# Patient Record
Sex: Male | Born: 1990 | Race: Black or African American | Hispanic: No | Marital: Single | Smoking: Never smoker
Health system: Southern US, Community
[De-identification: ages and names within clinical notes are randomized; demographics above are authoritative.]

## PROBLEM LIST (undated history)

## (undated) ENCOUNTER — Emergency Department (HOSPITAL_COMMUNITY): Discharge status: Absent without leave | Payer: Self-pay

---
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
## 2015-02-23 ENCOUNTER — Encounter (HOSPITAL_COMMUNITY): Payer: Self-pay | Admitting: Emergency Medicine

## 2015-02-23 ENCOUNTER — Emergency Department (HOSPITAL_COMMUNITY)
Admission: EM | Admit: 2015-02-23 | Discharge: 2015-02-23 | Disposition: A | Discharge status: Home / Self Care | Payer: Self-pay | Attending: Emergency Medicine | Admitting: Emergency Medicine

## 2015-02-23 DIAGNOSIS — Y9289 Other specified places as the place of occurrence of the external cause: Secondary | ICD-10-CM | POA: Insufficient documentation

## 2015-02-23 DIAGNOSIS — S46911A Strain of unspecified muscle, fascia and tendon at shoulder and upper arm level, right arm, initial encounter: Secondary | ICD-10-CM | POA: Insufficient documentation

## 2015-02-23 DIAGNOSIS — Y998 Other external cause status: Secondary | ICD-10-CM | POA: Insufficient documentation

## 2015-02-23 DIAGNOSIS — Y9389 Activity, other specified: Secondary | ICD-10-CM | POA: Insufficient documentation

## 2015-02-23 DIAGNOSIS — X500XXA Overexertion from strenuous movement or load, initial encounter: Secondary | ICD-10-CM | POA: Insufficient documentation

## 2015-02-23 MED ORDER — METHOCARBAMOL 500 MG PO TABS: 500.0000 mg | ORAL_TABLET | Freq: Two times a day (BID) | ORAL | Status: DC

## 2015-02-23 MED ORDER — IBUPROFEN 800 MG PO TABS: 800.0000 mg | ORAL_TABLET | Freq: Three times a day (TID) | ORAL | Status: DC

## 2015-02-23 NOTE — ED Notes (Signed)
Pt c/o right shoulder pain x 2 days. States it hurts to abduct the arm. States has had "twinges" on and off for a while.

## 2015-02-23 NOTE — ED Provider Notes (Signed)
CSN: [id number]     Arrival date & time 02/23/15  1317 History  By signing my name below, I, Elon SpannerGarrett Cook, attest that this documentation has been prepared under the direction and in the presence of Fayrene HelperBowie Lyssa Hackley, PA-C. Electronically Signed: Elon SpannerGarrett Cook ED Scribe. 02/23/2015. 1:55 PM.    Chief Complaint  Patient presents with  . Shoulder Pain   The history is provided by the patient. No language interpreter was used.   HPI Comments: Gregory Berg is a 24 y.o. left-handed male who presents to the Emergency Department complaining of sharp, recurrently sudden, non-radiating onset right shoulder pain onset two weeks ago; worse with lifting the arm; tx'd successfully with ice/ibuprofen (this am).  He reports frequent heavy lifting where he works at [employer] and worsening yesterday after loading a truck at work.  He denies neck pain, right elbow pain, right arm pain, fever.    History reviewed. No pertinent past medical history. History reviewed. No pertinent past surgical history. History reviewed. No pertinent family history. Social History  Substance Use Topics  . Smoking status: Never Smoker   . Smokeless tobacco: None  . Alcohol Use: No     Review of Systems  Constitutional: Negative for fever.  Musculoskeletal: Positive for arthralgias. Negative for neck pain.      Allergies  Review of patient's allergies indicates no known allergies.  Home Medications   Prior to Admission medications   Not on File   BP 118/69 mmHg  Pulse 85  Temp(Src) 97.9 F (36.6 C) (Oral)  Resp 18  SpO2 98% Physical Exam  Constitutional: He is oriented to person, place, and time. He appears well-developed and well-nourished. No distress.  HENT:  Head: Normocephalic and atraumatic.  Eyes: Conjunctivae and EOM are normal.  Neck: Neck supple. No tracheal deviation present.  Cardiovascular: Normal rate.   Pulmonary/Chest: Effort normal. No respiratory distress.  Musculoskeletal: Normal range of  motion.  No c-spine tenderness.  Tenderness to right lateral trapezius.  Point tenderness noted to posterior right shoulder on palpation at the deltoid insertion.  No gross deformity.  Decreased shoulder flexion, unable to point straight to the sky.   Normal shoulder ab/adduction.  Normal grip strength radial pulse 2+.  Right elbow normal.    Neurological: He is alert and oriented to person, place, and time.  Skin: Skin is warm and dry.  Psychiatric: He has a normal mood and affect. His behavior is normal.  Nursing note and vitals reviewed.   ED Course  Procedures (including critical care time)  DIAGNOSTIC STUDIES: Oxygen Saturation is 98% on RA, normal by my interpretation.    COORDINATION OF CARE:  1:56 PM suspect R shoulder strain.  Low suspicion for radiculopathy, fractures, or dislocation.  Pt is NVI.  Will provide referral to orthopaedist, work note, and muscle relaxant.  Patient acknowledges and agrees with plan.    MDM   Final diagnoses:  Right shoulder strain, initial encounter    BP 118/69 mmHg  Pulse 85  Temp(Src) 97.9 F (36.6 C) (Oral)  Resp 18  SpO2 98%   I personally performed the services described in this documentation, which was scribed in my presence. The recorded information has been reviewed and is accurate.      Fayrene HelperBowie Brexton Sofia, PA-C 02/23/15 1416  Blane OharaJoshua Zavitz, MD 02/23/15 [phone]

## 2015-02-23 NOTE — ED Notes (Signed)
Pt sts right shoulder pain upon waking this am; pt sts worse with movement

## 2015-02-23 NOTE — Discharge Instructions (Signed)

## 2015-04-27 ENCOUNTER — Emergency Department (HOSPITAL_COMMUNITY)
Admission: EM | Admit: 2015-04-27 | Discharge: 2015-04-27 | Disposition: A | Discharge status: Home / Self Care | Payer: Self-pay

## 2015-04-27 NOTE — ED Notes (Signed)
Called x3 for triage with no answer 

## 2016-06-29 ENCOUNTER — Encounter (HOSPITAL_COMMUNITY): Payer: Self-pay

## 2016-06-29 ENCOUNTER — Emergency Department (HOSPITAL_COMMUNITY)
Admission: EM | Admit: 2016-06-29 | Discharge: 2016-06-29 | Disposition: A | Discharge status: Home / Self Care | Payer: Self-pay | Attending: Emergency Medicine | Admitting: Emergency Medicine

## 2016-06-29 ENCOUNTER — Emergency Department (HOSPITAL_COMMUNITY): Payer: Self-pay

## 2016-06-29 DIAGNOSIS — J111 Influenza due to unidentified influenza virus with other respiratory manifestations: Secondary | ICD-10-CM | POA: Insufficient documentation

## 2016-06-29 DIAGNOSIS — J101 Influenza due to other identified influenza virus with other respiratory manifestations: Secondary | ICD-10-CM

## 2016-06-29 LAB — I-STAT CHEM 8, ED
BUN: 8 mg/dL (ref 6–20)
CHLORIDE: 98 mmol/L — AB (ref 101–111)
Calcium, Ion: 1.03 mmol/L — ABNORMAL LOW (ref 1.15–1.40)
Creatinine, Ser: 0.9 mg/dL (ref 0.61–1.24)
Glucose, Bld: 98 mg/dL (ref 65–99)
HEMATOCRIT: 44 % (ref 39.0–52.0)
Hemoglobin: 15 g/dL (ref 13.0–17.0)
POTASSIUM: 3.2 mmol/L — AB (ref 3.5–5.1)
SODIUM: 138 mmol/L (ref 135–145)
TCO2: 22 mmol/L (ref 0–100)

## 2016-06-29 LAB — INFLUENZA PANEL BY PCR (TYPE A & B)
Influenza A By PCR: NEGATIVE
Influenza B By PCR: POSITIVE — AB

## 2016-06-29 MED ORDER — ACETAMINOPHEN 500 MG PO TABS
1000.0000 mg | ORAL_TABLET | Freq: Once | ORAL | Status: AC
  Administered 2016-06-29: 1000 mg via ORAL
  Filled 2016-06-29: qty 2

## 2016-06-29 MED ORDER — IPRATROPIUM-ALBUTEROL 0.5-2.5 (3) MG/3ML IN SOLN
3.0000 mL | Freq: Once | RESPIRATORY_TRACT | Status: AC
  Administered 2016-06-29: 3 mL via RESPIRATORY_TRACT
  Filled 2016-06-29: qty 3

## 2016-06-29 MED ORDER — IBUPROFEN 800 MG PO TABS: 800.0000 mg | ORAL_TABLET | Freq: Three times a day (TID) | ORAL | 0 refills | Status: DC | PRN

## 2016-06-29 MED ORDER — ALBUTEROL SULFATE HFA 108 (90 BASE) MCG/ACT IN AERS
2.0000 | INHALATION_SPRAY | Freq: Once | RESPIRATORY_TRACT | Status: AC
  Administered 2016-06-29: 2 via RESPIRATORY_TRACT
  Filled 2016-06-29: qty 6.7

## 2016-06-29 MED ORDER — IBUPROFEN 800 MG PO TABS
800.0000 mg | ORAL_TABLET | Freq: Once | ORAL | Status: AC
  Administered 2016-06-29: 800 mg via ORAL
  Filled 2016-06-29: qty 1

## 2016-06-29 MED ORDER — BENZONATATE 100 MG PO CAPS: 100.0000 mg | ORAL_CAPSULE | Freq: Every evening | ORAL | 0 refills | Status: DC | PRN

## 2016-06-29 NOTE — ED Notes (Signed)
Spoke to lab, states influenza swab positive for Flu B

## 2016-06-29 NOTE — ED Notes (Signed)
Pt refused VS at this time, states he is cramping and does not want O2 sat or BP on his arm/hands.

## 2016-06-29 NOTE — ED Notes (Signed)
Pt continues to refuse BP, stating "it gets too tight and causes more cramping". Pt agreed to keep pulse ox on finger.

## 2016-06-29 NOTE — ED Notes (Signed)
Pt. Hyperventilating, encourage proper breathing. Slow, in through nose out through mouth.

## 2016-06-29 NOTE — ED Notes (Signed)
Patient transported to X-ray 

## 2016-06-29 NOTE — Discharge Instructions (Signed)
Read the information below.  Use the prescribed medication as directed.  Please discuss all new medications with your pharmacist.  You may return to the Emergency Department at any time for worsening condition or any new symptoms that concern you.  If you develop high fevers that do not resolve with tylenol or ibuprofen, you have difficulty swallowing or breathing, or you are unable to tolerate fluids by mouth, return to the ER for a recheck.    °

## 2016-06-29 NOTE — ED Triage Notes (Signed)
Patient complains of cough and cold symptoms x 2 days. On arrival hyperventilating with arms contracted and screaming out at staff that he cannot breath. Alert and oriented, difficult to assess due to anxiety. sats 100%

## 2016-06-29 NOTE — ED Provider Notes (Signed)
[facility]-EMERGENCY DEPT Provider Note   CSN: [id number] Arrival date & time: 06/29/16  0934     History   Chief Complaint Chief Complaint  Patient presents with  . cough, hyperventilating    HPI Gregory Berg is a 26 y.o. male.  HPI   Patient presents with subjective fever, myalgias, sneezing, coughing, SOB, sore throat, nausea, vomiting, diarrhea since yesterday.  He has used mucinex, vaporub, theraflu without improvement. Denies abdominal pain, hemoptysis.  He has no medical problems.  He did not get a flu shot this season.    History reviewed. No pertinent past medical history.  There are no active problems to display for this patient.   History reviewed. No pertinent surgical history.     Home Medications    Prior to Admission medications   Medication Sig Start Date End Date Taking? Authorizing Provider  benzonatate (TESSALON) 100 MG capsule Take 1 capsule (100 mg total) by mouth at bedtime as needed for cough. 06/29/16   Trixie DredgeEmily Elease Swarm, PA-C  ibuprofen (ADVIL,MOTRIN) 800 MG tablet Take 1 tablet (800 mg total) by mouth every 8 (eight) hours as needed for mild pain or moderate pain. 06/29/16   Trixie DredgeEmily Naliyah Neth, PA-C  methocarbamol (ROBAXIN) 500 MG tablet Take 1 tablet (500 mg total) by mouth 2 (two) times daily. 02/23/15   Fayrene HelperBowie Tran, PA-C    Family History No family history on file.  Social History Social History  Substance Use Topics  . Smoking status: Never Smoker  . Smokeless tobacco: Not on file  . Alcohol use No     Allergies   Patient has no known allergies.   Review of Systems Review of Systems  All other systems reviewed and are negative.    Physical Exam Updated Vital Signs BP 140/67 (BP Location: Right Arm)   Pulse 75   Temp (S) 100.6 F (38.1 C) (Oral)   Resp 16   Ht 6\' 3"  (1.905 m)   Wt 76.7 kg   SpO2 98%   BMI 21.12 kg/m   Physical Exam  Constitutional: He appears well-developed and well-nourished. No distress.  HENT:  Head:  Normocephalic and atraumatic.  Neck: Neck supple.  Cardiovascular: Normal rate and regular rhythm.   Pulmonary/Chest: Effort normal and breath sounds normal. Tachypnea noted. No respiratory distress. He has no wheezes. He has no rales.  Abdominal: Soft. He exhibits no distension and no mass. There is no tenderness. There is no rebound and no guarding.  Neurological: He is alert. He exhibits normal muscle tone.  Skin: He is not diaphoretic.  Nursing note and vitals reviewed.    ED Treatments / Results  Labs (all labs ordered are listed, but only abnormal results are displayed) Labs Reviewed  INFLUENZA PANEL BY PCR (TYPE A & B) - Abnormal; Notable for the following:       Result Value   Influenza B By PCR POSITIVE (*)    All other components within normal limits  I-STAT CHEM 8, ED - Abnormal; Notable for the following:    Potassium 3.2 (*)    Chloride 98 (*)    Calcium, Ion 1.03 (*)    All other components within normal limits    EKG  EKG Interpretation None       Radiology Dg Chest 2 View  Result Date: 06/29/2016 CLINICAL DATA:  Cough and congestion for 2 days. EXAM: CHEST  2 VIEW COMPARISON:  None. FINDINGS: The cardiomediastinal silhouette is unremarkable. Mild peribronchial thickening noted. There is no evidence of focal  airspace disease, pulmonary edema, suspicious pulmonary nodule/mass, pleural effusion, or pneumothorax. No acute bony abnormalities are identified. IMPRESSION: Mild peribronchial thickening without focal pneumonia. Electronically Signed   By: Harmon Pier M.D.   On: 06/29/2016 11:02    Procedures Procedures (including critical care time)  Medications Ordered in ED Medications  ipratropium-albuterol (DUONEB) 0.5-2.5 (3) MG/3ML nebulizer solution 3 mL (3 mLs Nebulization Given 06/29/16 1038)  ibuprofen (ADVIL,MOTRIN) tablet 800 mg (800 mg Oral Given 06/29/16 1240)  acetaminophen (TYLENOL) tablet 1,000 mg (1,000 mg Oral Given 06/29/16 1312)  albuterol  (PROVENTIL HFA;VENTOLIN HFA) 108 (90 Base) MCG/ACT inhaler 2 puff (2 puffs Inhalation Given 06/29/16 1341)     Initial Impression / Assessment and Plan / ED Course  I have reviewed the triage vital signs and the nursing notes.  Pertinent labs & imaging results that were available during my care of the patient were reviewed by me and considered in my medical decision making (see chart for details).     Afebrile, nontoxic patient with constellation of symptoms suggestive of viral syndrome, influenza positive.  No concerning findings on exam.  Discharged home with supportive care, PCP follow up.  Discussed result, findings, treatment, and follow up  with patient.  Pt given return precautions.  Pt verbalizes understanding and agrees with plan.      Final Clinical Impressions(s) / ED Diagnoses   Final diagnoses:  Influenza B    New Prescriptions Discharge Medication List as of 06/29/2016  1:33 PM    START taking these medications   Details  benzonatate (TESSALON) 100 MG capsule Take 1 capsule (100 mg total) by mouth at bedtime as needed for cough., Starting Sun 06/29/2016, Print         [place], PA-C 06/29/16 1428    Lorre Nick, MD 06/29/16 [phone]

## 2016-06-29 NOTE — ED Notes (Signed)
ED Provider at bedside. 

## 2016-06-29 NOTE — ED Notes (Signed)
Pt. C/o blood pressure cuff too tight and making me breathe bad.

## 2016-06-29 NOTE — ED Notes (Signed)
Pt. Slamming his belongings around in the room.

## 2016-12-09 ENCOUNTER — Encounter (HOSPITAL_COMMUNITY): Payer: Self-pay | Admitting: *Deleted

## 2016-12-09 DIAGNOSIS — K0889 Other specified disorders of teeth and supporting structures: Secondary | ICD-10-CM | POA: Insufficient documentation

## 2016-12-09 DIAGNOSIS — Z5321 Procedure and treatment not carried out due to patient leaving prior to being seen by health care provider: Secondary | ICD-10-CM | POA: Insufficient documentation

## 2016-12-09 NOTE — ED Triage Notes (Signed)
Right upper dental pain x 4 days.

## 2016-12-10 ENCOUNTER — Emergency Department (HOSPITAL_COMMUNITY)
Admission: EM | Admit: 2016-12-10 | Discharge: 2016-12-10 | Disposition: A | Discharge status: Home / Self Care | Payer: Self-pay | Attending: Emergency Medicine | Admitting: Emergency Medicine

## 2016-12-10 NOTE — ED Notes (Signed)
Pt called in lobby several times for reassessment, no response

## 2016-12-11 ENCOUNTER — Encounter (HOSPITAL_COMMUNITY): Payer: Self-pay | Admitting: Emergency Medicine

## 2016-12-11 ENCOUNTER — Ambulatory Visit (HOSPITAL_COMMUNITY)
Admission: EM | Admit: 2016-12-11 | Discharge: 2016-12-11 | Disposition: A | Discharge status: Home / Self Care | Payer: Self-pay | Attending: Emergency Medicine | Admitting: Emergency Medicine

## 2016-12-11 DIAGNOSIS — K047 Periapical abscess without sinus: Secondary | ICD-10-CM

## 2016-12-11 DIAGNOSIS — K029 Dental caries, unspecified: Secondary | ICD-10-CM

## 2016-12-11 MED ORDER — HYDROCODONE-ACETAMINOPHEN 5-325 MG PO TABS: ORAL_TABLET | ORAL | 0 refills | Status: AC

## 2016-12-11 MED ORDER — IBUPROFEN 600 MG PO TABS
600.0000 mg | ORAL_TABLET | Freq: Four times a day (QID) | ORAL | 0 refills | Status: AC | PRN
Start: 2016-12-11 — End: ?

## 2016-12-11 MED ORDER — PENICILLIN V POTASSIUM 500 MG PO TABS: 500.0000 mg | ORAL_TABLET | Freq: Four times a day (QID) | ORAL | 0 refills | Status: AC

## 2016-12-11 MED ORDER — LIDOCAINE VISCOUS 2 % MT SOLN: 10.0000 mL | Freq: Four times a day (QID) | OROMUCOSAL | 0 refills | Status: AC | PRN

## 2016-12-11 MED ORDER — CHLORHEXIDINE GLUCONATE 0.12 % MT SOLN: OROMUCOSAL | 0 refills | Status: AC

## 2016-12-11 NOTE — ED Provider Notes (Signed)
HPI  SUBJECTIVE:  Gregory Berg is a 26 y.o. male who presents with right upper dental pain for the past 5 days. He states is sharp, throbbing, constant. It is sensitive to temperature, eating, drinking. He states that he is not eating secondary to the pain but is tolerating fluids. He reports upper facial swelling but this has since resolved. Reports trismus secondary to pain. No trauma to his tooth. States that the teeth were already decayed. No fevers, swelling under his jaw, sore throat, sensation and throat swelling shut, difficulty breathing. He tried Orajel, tooth aid, saltwater rinses, Tylenol, Advil and a family member's Norco. States that he rubbed 1 tablet on the area and took the other one by mouth. States that none of this works. Symptoms are worse with opening his mouth, eating, drinking, exposure air. Past medical history negative for diabetes, hypertension. Patient states that he has a dentist appointment at 1540 today but could not wait to be seen due to the pain.   History reviewed. No pertinent past medical history.  History reviewed. No pertinent surgical history.  No family history on file.  Social History  Substance Use Topics  . Smoking status: Never Smoker  . Smokeless tobacco: Not on file  . Alcohol use No    No current facility-administered medications for this encounter.   Current Outpatient Prescriptions:  .  chlorhexidine (PERIDEX) 0.12 % solution, 15 mL swish and spit bid, Disp: 480 mL, Rfl: 0 .  HYDROcodone-acetaminophen (NORCO/VICODIN) 5-325 MG tablet, 1-2 tabs q 6hr prn pain, Disp: 20 tablet, Rfl: 0 .  ibuprofen (ADVIL,MOTRIN) 600 MG tablet, Take 1 tablet (600 mg total) by mouth every 6 (six) hours as needed., Disp: 30 tablet, Rfl: 0 .  lidocaine (XYLOCAINE) 2 % solution, Use as directed 10 mLs in the mouth or throat every 6 (six) hours as needed for mouth pain. Hold in mouth and spit. Do not swallow., Disp: 100 mL, Rfl: 0 .  penicillin v potassium  (VEETID) 500 MG tablet, Take 1 tablet (500 mg total) by mouth 4 (four) times daily., Disp: 28 tablet, Rfl: 0  No Known Allergies   ROS  As noted in HPI.   Physical Exam  BP 129/89 (BP Location: Right Arm)   Pulse 65   Temp 98.2 F (36.8 C) (Oral)   Resp 14   Ht 6\' 4"  (1.93 m)   Wt 172 lb (78 kg)   SpO2 95%   BMI 20.94 kg/m   Constitutional: Well developed, well nourished, moderate painful distress Eyes:  EOMI, conjunctiva normal bilaterally HENT: Normocephalic, atraumatic,mucus membranes moist. Extensive dental decay. Tooth #3 and 2, the first and second molar decay with tender to palpation. Positive gingival tenderness. No appreciable gingival swelling or expressible purulent drainage. Mild trismus. No facial swelling or swelling underneath the jaw. Neck: No cervical lymphadenopathy Respiratory: Normal inspiratory effort Cardiovascular: Normal rate GI: nondistended skin: No rash, skin intact Musculoskeletal: no deformities Neurologic: Alert & oriented x 3, no focal neuro deficits Psychiatric: Speech and behavior appropriate   ED Course   Medications - No data to display  No orders of the defined types were placed in this encounter.   No results found for this or any previous visit (from the past 24 hour(s)). No results found.  ED Clinical Impression  Pain due to dental caries  Dental infection   ED Assessment/Plan  [place] Narcotic database reviewed for this patient, and feel that the risk/benefit ratio today is favorable for proceeding with a prescription for  controlled substance.No opiate prescriptions in the past 6 months.  Patient appears to be in a significant amount of pain but he declined a dental block. He does have a appointment with a dentist at 1540 today. Home with penicillin, Peridex or Listerine. He is to continue the saltwater rinses, Norco combined with ibuprofen 600 mg 3-4 times a day for severe pain or Tylenol 1 g 600 mg of ibuprofen 3-4 times a  day for mild to moderate pain. Also viscous lidocaine. Also discussed with him the importance of following up with a dentist for definitive care. Patient agrees with plan.   Meds ordered this encounter  Medications  . HYDROcodone-acetaminophen (NORCO/VICODIN) 5-325 MG tablet    Sig: 1-2 tabs q 6hr prn pain    Dispense:  20 tablet    Refill:  0  . chlorhexidine (PERIDEX) 0.12 % solution    Sig: 15 mL swish and spit bid    Dispense:  480 mL    Refill:  0  . penicillin v potassium (VEETID) 500 MG tablet    Sig: Take 1 tablet (500 mg total) by mouth 4 (four) times daily.    Dispense:  28 tablet    Refill:  0  . ibuprofen (ADVIL,MOTRIN) 600 MG tablet    Sig: Take 1 tablet (600 mg total) by mouth every 6 (six) hours as needed.    Dispense:  30 tablet    Refill:  0  . lidocaine (XYLOCAINE) 2 % solution    Sig: Use as directed 10 mLs in the mouth or throat every 6 (six) hours as needed for mouth pain. Hold in mouth and spit. Do not swallow.    Dispense:  100 mL    Refill:  0    *This clinic note was created using Scientist, clinical (histocompatibility and immunogenetics). Therefore, there may be occasional mistakes despite careful proofreading.  ?   Domenick Gong, MD 12/11/16 1308

## 2016-12-11 NOTE — Discharge Instructions (Signed)
Penicillin is an antibiotic and will help with an infection. Peridex or Listerine to help with an infection as well. continue the saltwater rinses, take 1-2 tabs of the Norco combined with ibuprofen 600 mg 3-4 times a day for severe pain or you may take Tylenol 1 g 600 mg of ibuprofen 3-4 times a day for mild to moderate pain. Do not take the Norco and Tylenol at the same time as they both have Tylenol in them and too much Tylenol can hurt your liver. Each tablet of Norco S3 125 mg of Tylenol in it. Do not exceed 4 g of Tylenol from all sources in one day. Hold the viscous lidocaine in your mouth for as long as he possibly can and spit it out. Do not swallow. This may help numb your tooth as well.  Look up the [facility] [employer]'s [facility] for free dental clinics. https://www.williams-garcia.biz/.asp  Get there early and be prepared to wait. [employer] Guile and [employer] have Armed forces operational officer schools that provide low cost routine dental care.   Other resources: [facility] [address] [place], [place] [phone]  Patients with Medicaid: [facility] [phone] [address]                                [phone] [address] Phone:  [phone]                                                  Phone:  [phone]  Dr. Renee Rival [address] [phone]  If unable to pay or uninsured, contact:  [facility] or [facility]. to become qualified for the adult dental clinic.  No matter what dental problem you have, it will not get better unless you get good dental care.  If the tooth is not taken care of, your symptoms will come back in time and you will be visiting [place] again in the Urgent Care Center with a bad toothache.  So, see your dentist as soon as possible.  If you don't have a dentist, we can give you a list of dentists.  Sometimes the most cost effective treatment is removal of  the tooth.  This can be done very inexpensively through one of the low cost [employer], broadcasting/film/video such as the facility on [facility] in [place] [phone]).  The downside to this is that you will have one less tooth and this can effect your ability to chew.  Some other things that can be done for a dental infection include the following:  Rinse your mouth out with hot salt water (1/2 tsp of table salt and a pinch of baking soda in 8 oz of hot water).  You can do this every 2 or 3 hours. Avoid cold foods, beverages, and cold air.  This will make your symptoms worse. Sleep with your head elevated.  Sleeping flat will cause your gums and oral tissues to swell and make them hurt more.  You can sleep on several pillows.  Even better is to sleep in a recliner with your head higher than your heart. For mild  to moderate pain, you can take Tylenol, ibuprofen, or Aleve. External application of heat by a heating pad, hot water bottle, or hot wet towel can help with pain and speed healing.  You can do this every 2 to 3 hours. Do not fall asleep on a heating pad since this can cause a burn.   Go to www.goodrx.com to look up your medications. This will give you a list of where you can find your prescriptions at the most affordable prices. Or ask the pharmacist what the cash price is, or if they have any other discount programs available to help make your medication more affordable. This can be less expensive than what you would pay with insurance.

## 2016-12-11 NOTE — ED Triage Notes (Signed)
Patient reports a toothache on top, right of mouth.  Onset last week around Thursday 12/04/16

## 2017-08-15 IMAGING — DX DG CHEST 2V
2 series · 3 of 3 positions shown · non-contrast
Comparison: None.

CLINICAL DATA: Cough and congestion for 2 days.

EXAM:
CHEST  2 VIEW

[chest lat]
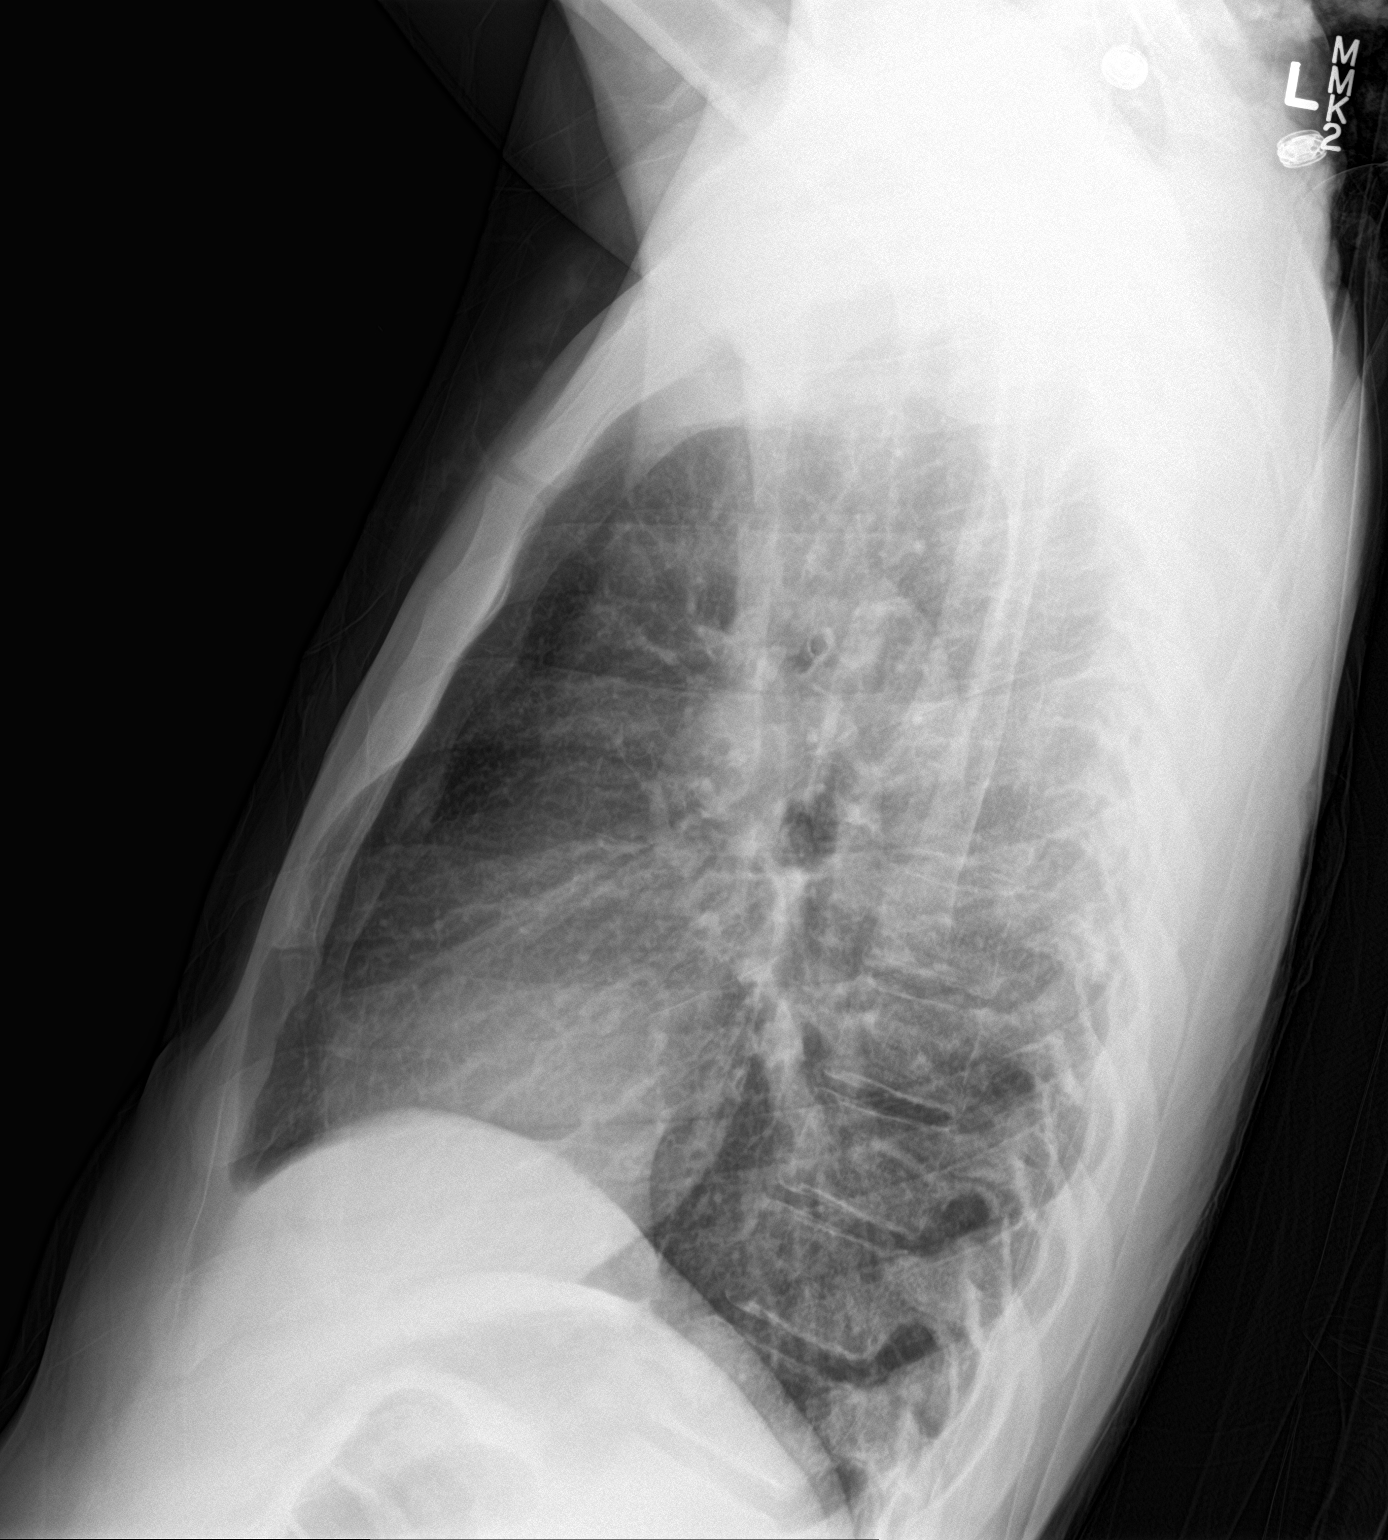

[Series 3: chest ap · 0.14mm/px · 2 of 2 slices shown]
[im 1/2]
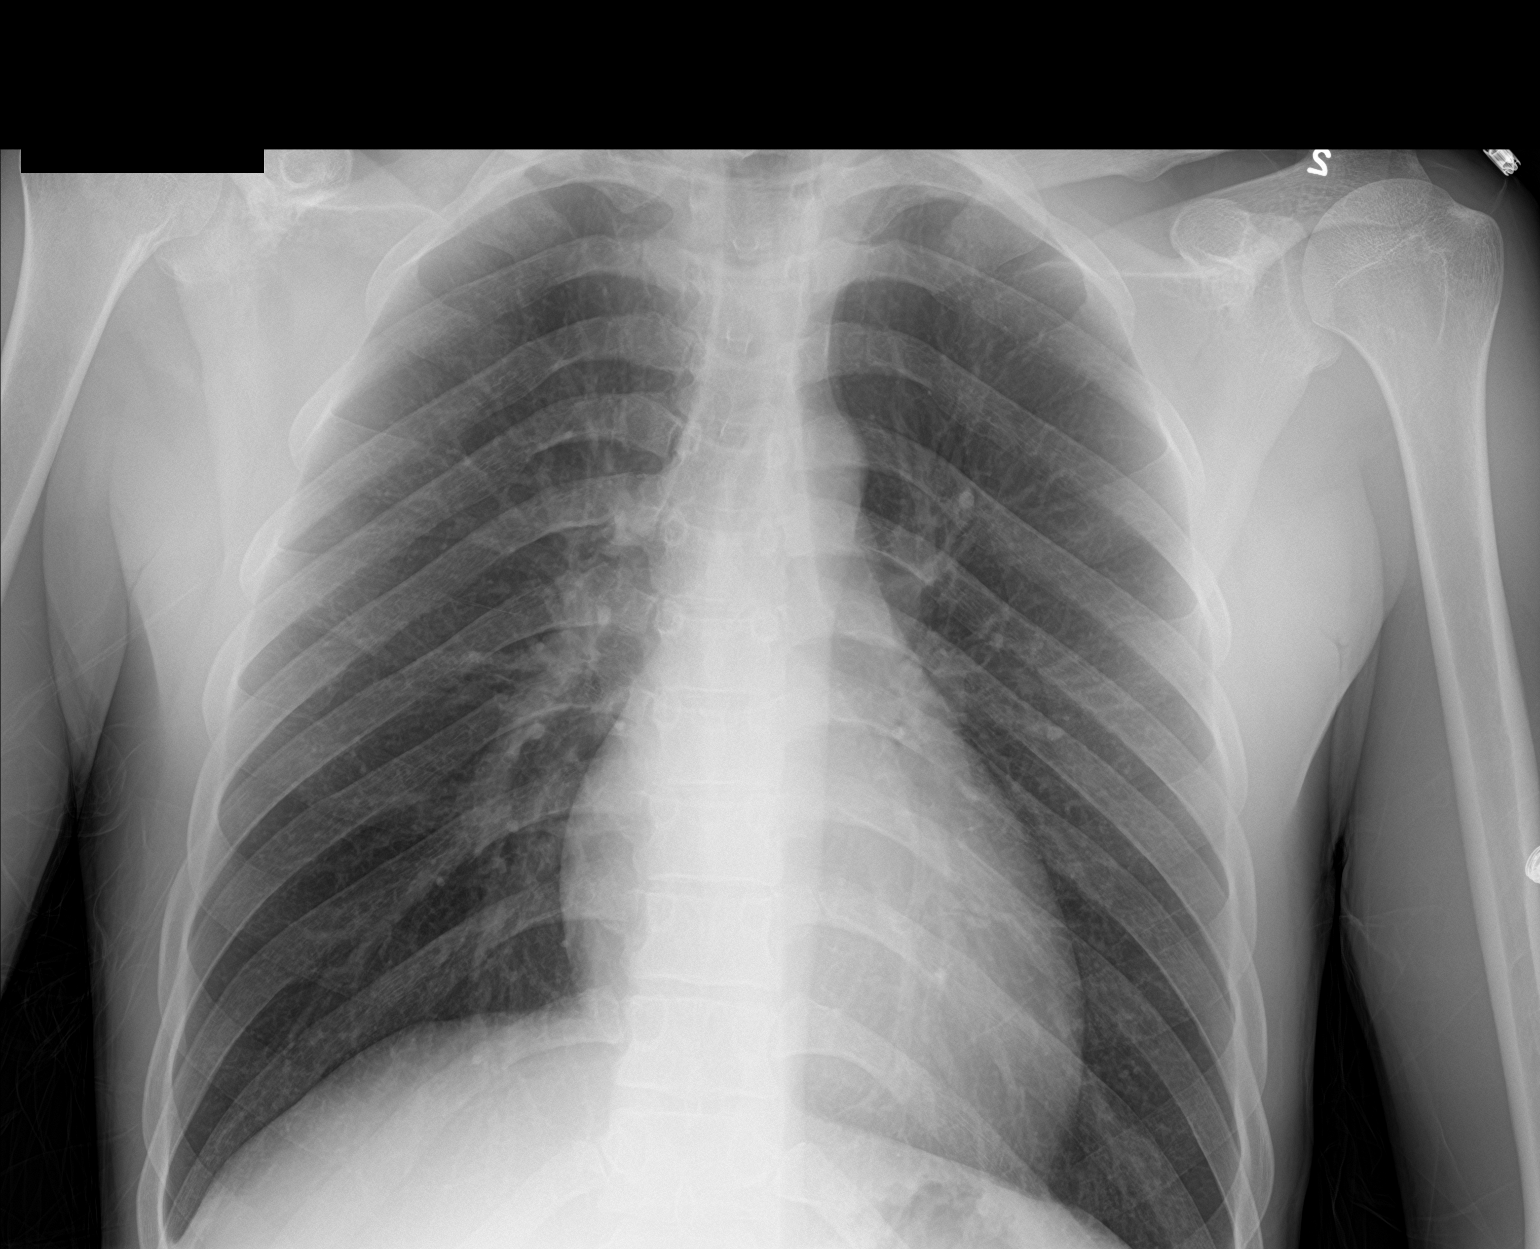
[im 2/2]
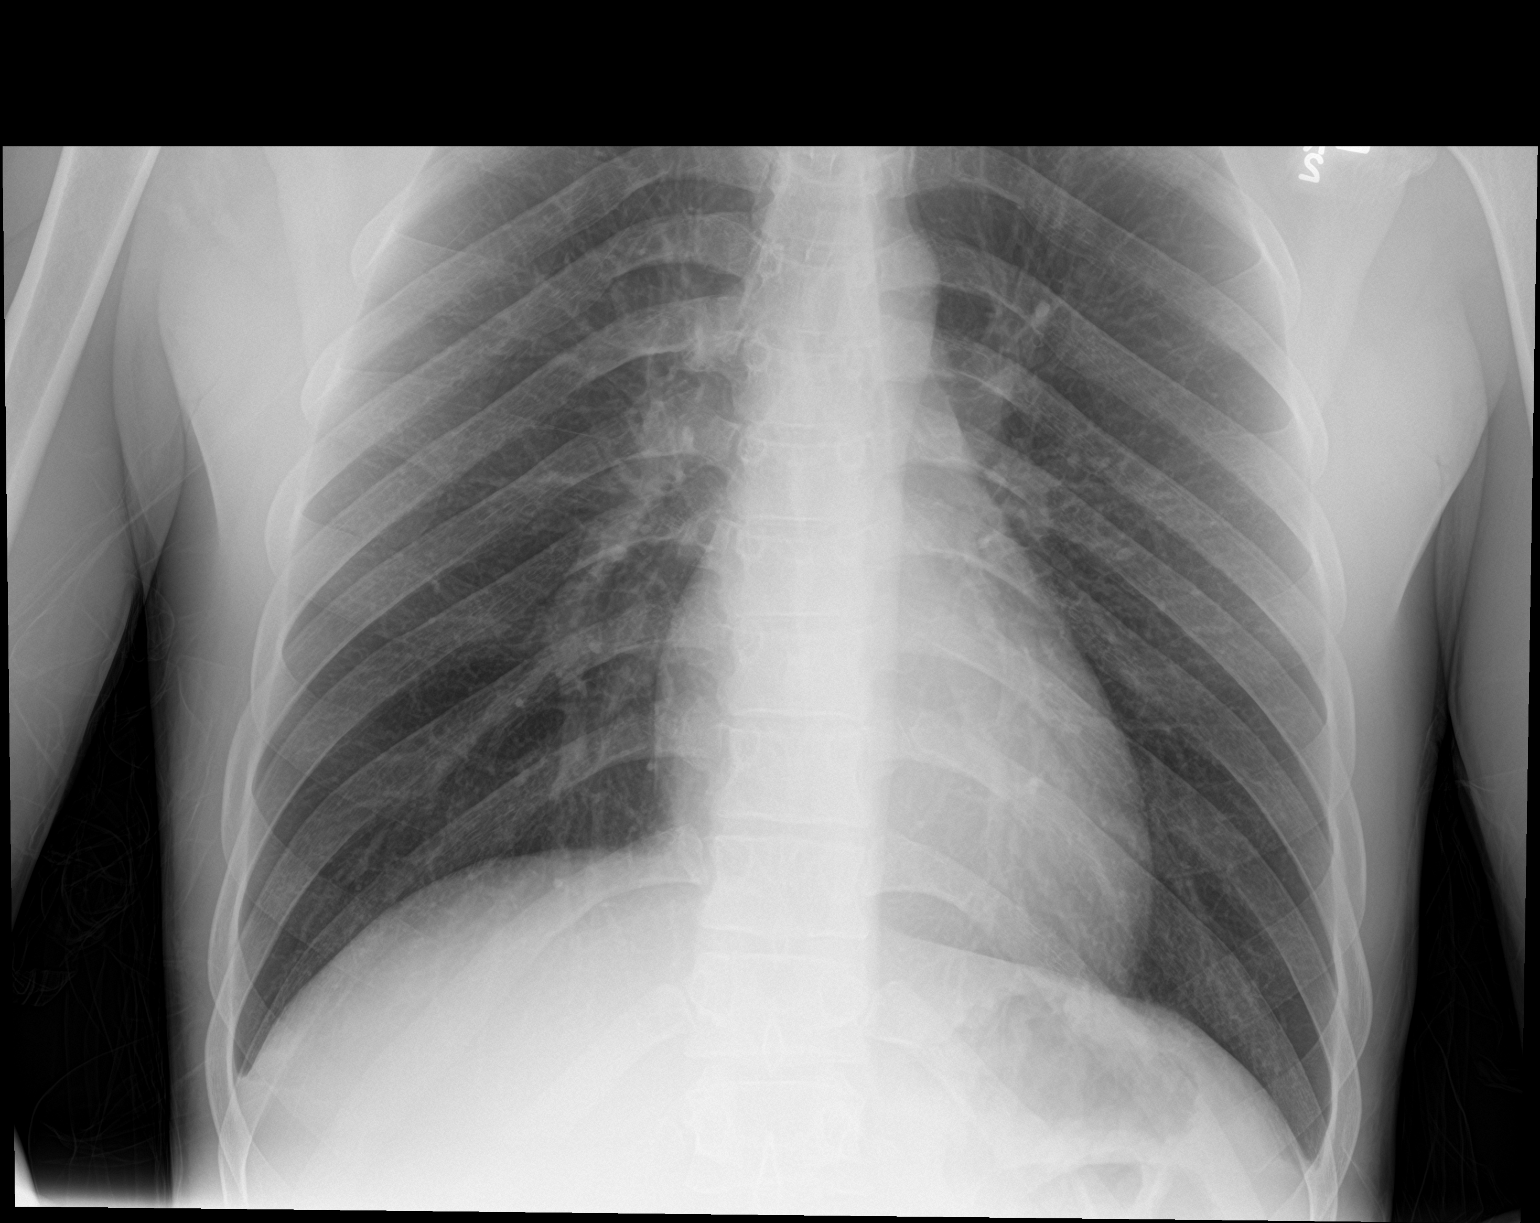

[3 of 3 positions shown; findings below may reference images not displayed]

FINDINGS: The cardiomediastinal silhouette is unremarkable.

Mild peribronchial thickening noted.

There is no evidence of focal airspace disease, pulmonary edema,
suspicious pulmonary nodule/mass, pleural effusion, or pneumothorax.
No acute bony abnormalities are identified.
IMPRESSION: Mild peribronchial thickening without focal pneumonia.

## 2018-06-26 ENCOUNTER — Emergency Department (HOSPITAL_COMMUNITY)
Admission: EM | Admit: 2018-06-26 | Discharge: 2018-06-26 | Disposition: A | Discharge status: Home / Self Care | Payer: Self-pay | Attending: Emergency Medicine | Admitting: Emergency Medicine

## 2018-06-26 ENCOUNTER — Encounter (HOSPITAL_COMMUNITY): Payer: Self-pay

## 2018-06-26 DIAGNOSIS — H6122 Impacted cerumen, left ear: Secondary | ICD-10-CM | POA: Insufficient documentation

## 2018-06-26 MED ORDER — NEOMYCIN-COLIST-HC-THONZONIUM 3.3-3-10-0.5 MG/ML OT SUSP
3.0000 [drp] | Freq: Once | OTIC | Status: DC
  Filled 2018-06-26: qty 10

## 2018-06-26 NOTE — ED Provider Notes (Signed)
[facility]-EMERGENCY DEPT Provider Note   CSN: [id number] Arrival date & time: 06/26/18  1159    History   Chief Complaint Chief Complaint  Patient presents with  . Otalgia    HPI Gregory Berg is a 28 y.o. male who presents to the ED with left ear pain. Patient reports that last night he was cleaning his ears with a Q-tip and when pushing down in the left ear felt something that felt like a rock. He tried with a tooth pick to get around the hard area to get it out without success.      HPI  History reviewed. No pertinent past medical history.  There are no active problems to display for this patient.   History reviewed. No pertinent surgical history.      Home Medications    Prior to Admission medications   Medication Sig Start Date End Date Taking? Authorizing Provider  chlorhexidine (PERIDEX) 0.12 % solution 15 mL swish and spit bid 12/11/16   Domenick Gong, MD  HYDROcodone-acetaminophen (NORCO/VICODIN) 5-325 MG tablet 1-2 tabs q 6hr prn pain 12/11/16   Domenick Gong, MD  ibuprofen (ADVIL,MOTRIN) 600 MG tablet Take 1 tablet (600 mg total) by mouth every 6 (six) hours as needed. 12/11/16   Domenick Gong, MD  lidocaine (XYLOCAINE) 2 % solution Use as directed 10 mLs in the mouth or throat every 6 (six) hours as needed for mouth pain. Hold in mouth and spit. Do not swallow. 12/11/16   Domenick Gong, MD    Family History History reviewed. No pertinent family history.  Social History Social History   Tobacco Use  . Smoking status: Never Smoker  . Smokeless tobacco: Never Used  Substance Use Topics  . Alcohol use: No  . Drug use: Yes    Types: Marijuana     Allergies   Patient has no known allergies.   Review of Systems Review of Systems  HENT: Positive for ear pain.   All other systems reviewed and are negative.    Physical Exam Updated Vital Signs BP 132/70 (BP Location: Right Arm)   Pulse 67   Temp 98 F (36.7  C) (Oral)   Resp 18   SpO2 100%   Physical Exam Vitals signs and nursing note reviewed.  Constitutional:      General: He is not in acute distress.    Appearance: He is well-developed.  HENT:     Head: Normocephalic.     Right Ear: Tympanic membrane normal.     Left Ear: There is impacted cerumen.     Mouth/Throat:     Mouth: Mucous membranes are moist.  Eyes:     Conjunctiva/sclera: Conjunctivae normal.  Neck:     Musculoskeletal: Neck supple. No neck rigidity.  Cardiovascular:     Rate and Rhythm: Normal rate.  Pulmonary:     Effort: Pulmonary effort is normal.  Musculoskeletal: Normal range of motion.  Skin:    General: Skin is warm and dry.  Neurological:     Mental Status: He is alert and oriented to person, place, and time.  Psychiatric:        Mood and Affect: Mood normal.      ED Treatments / Results  Labs (all labs ordered are listed, but only abnormal results are displayed) Labs Reviewed - No data to display  Radiology No results found.  Procedures .Ear Cerumen Removal Date/Time: 06/26/2018 1:04 PM Performed by: Janne Napoleon, NP Authorized by: Janne Napoleon, NP  Consent:    Consent obtained:  Verbal   Consent given by:  Patient   Risks discussed:  Pain and incomplete removal   Alternatives discussed:  Alternative treatment Procedure details:    Location:  L ear   Procedure type: irrigation   Post-procedure details:    Inspection:  TM intact   Hearing quality:  Normal   Patient tolerance of procedure:  Tolerated well, no immediate complications Comments:     Using a #18 angoicath and warm water, large piece of cerumen removed. Cortisporin Otic drops to left ear s/p procedure. Patient will continue to use the drops TID x 5 days.    (including critical care time)  Medications Ordered in ED Medications  neomycin-colistin-hydrocortisone-thonzonium (CORTISPORIN TC) OTIC (EAR) suspension 3 drop (has no administration in time range)      Initial Impression / Assessment and Plan / ED Course  I have reviewed the triage vital signs and the nursing notes. 28 y.o. male here with left ear pain stable for d/c after large amount of cerumen removed using irrigation. Patient to use the Cortisporin otic drops to the left ear TID x 5 days.   Final Clinical Impressions(s) / ED Diagnoses   Final diagnoses:  Impacted cerumen of left ear    ED Discharge Orders    None       Kerrie Buffalo [place], [place] 06/26/18 1316    Tegeler, Canary Brim, MD 06/26/18 1531

## 2018-06-26 NOTE — ED Notes (Signed)
Patient given discharge teaching and verbalized understanding. Patient ambulated out of ED with a steady gait. 

## 2018-06-26 NOTE — ED Triage Notes (Addendum)
Patient was cleaning his ears last night and felt something lodge deeper. Patient states it felt like a "rock" that went further deeper into his left ear and is now having pain. Patient then used a tooth pick to dig around it deeper with no relief. Patients mom then used tweezers to dig something else.  10/10 pain  A/Ox4 Ambulatory in triage.   On assessment ear drum is inflamed.

## 2018-06-26 NOTE — ED Notes (Signed)
Awaiting prescription to be sent down from pharmacy before discharge.

## 2018-06-26 NOTE — Discharge Instructions (Signed)
Use the ear drops 4 drops to the left ear three times a day for the next 5 days. Return as needed for any problems. Take tylenol or ibuprofen as needed for pain.

## 2022-02-20 ENCOUNTER — Emergency Department (HOSPITAL_COMMUNITY): Payer: Commercial Managed Care - HMO

## 2022-02-20 ENCOUNTER — Emergency Department (HOSPITAL_COMMUNITY)
Admission: EM | Admit: 2022-02-20 | Discharge: 2022-02-20 | Disposition: A | Discharge status: Home / Self Care | Payer: Commercial Managed Care - HMO | Attending: Emergency Medicine | Admitting: Emergency Medicine

## 2022-02-20 DIAGNOSIS — S92351A Displaced fracture of fifth metatarsal bone, right foot, initial encounter for closed fracture: Secondary | ICD-10-CM | POA: Diagnosis not present

## 2022-02-20 DIAGNOSIS — X501XXA Overexertion from prolonged static or awkward postures, initial encounter: Secondary | ICD-10-CM | POA: Insufficient documentation

## 2022-02-20 DIAGNOSIS — Y9339 Activity, other involving climbing, rappelling and jumping off: Secondary | ICD-10-CM | POA: Insufficient documentation

## 2022-02-20 DIAGNOSIS — S92021A Displaced fracture of anterior process of right calcaneus, initial encounter for closed fracture: Secondary | ICD-10-CM | POA: Diagnosis not present

## 2022-02-20 DIAGNOSIS — S99911A Unspecified injury of right ankle, initial encounter: Secondary | ICD-10-CM | POA: Diagnosis present

## 2022-02-20 DIAGNOSIS — S92901A Unspecified fracture of right foot, initial encounter for closed fracture: Secondary | ICD-10-CM

## 2022-02-20 MED ORDER — HYDROCODONE-ACETAMINOPHEN 5-325 MG PO TABS: 2.0000 | ORAL_TABLET | ORAL | 0 refills | Status: AC | PRN

## 2022-02-20 MED ORDER — FENTANYL CITRATE PF 50 MCG/ML IJ SOSY
50.0000 ug | PREFILLED_SYRINGE | Freq: Once | INTRAMUSCULAR | Status: AC
  Administered 2022-02-20: 50 ug via INTRAVENOUS
  Filled 2022-02-20: qty 1

## 2022-02-20 NOTE — ED Provider Notes (Addendum)
[facility]-EMERGENCY DEPT Provider Note   CSN: [id number] Arrival date & time: 02/20/22  1837     History  Chief Complaint  Patient presents with   Ankle Pain    Gregory Berg is a 31 y.o. male.  Patient presents to the hospital via EMS complaining of ankle/foot injury and swelling.  Patient complains of right-sided foot/ankle pain secondary to jumping over a fence and landing hard.  He states he had immediate pain.  EMS placed the patient in a splint.  He denies hitting his head or any other injuries at this time.  HPI     Home Medications Prior to Admission medications   Medication Sig Start Date End Date Taking? Authorizing Provider  HYDROcodone-acetaminophen (NORCO/VICODIN) 5-325 MG tablet Take 2 tablets by mouth every 4 (four) hours as needed. 02/20/22  Yes Darrick Grinder, PA-C  chlorhexidine (PERIDEX) 0.12 % solution 15 mL swish and spit bid 12/11/16   Domenick Gong, MD  HYDROcodone-acetaminophen (NORCO/VICODIN) 5-325 MG tablet 1-2 tabs q 6hr prn pain 12/11/16   Domenick Gong, MD  ibuprofen (ADVIL,MOTRIN) 600 MG tablet Take 1 tablet (600 mg total) by mouth every 6 (six) hours as needed. 12/11/16   Domenick Gong, MD  lidocaine (XYLOCAINE) 2 % solution Use as directed 10 mLs in the mouth or throat every 6 (six) hours as needed for mouth pain. Hold in mouth and spit. Do not swallow. 12/11/16   Domenick Gong, MD      Allergies    Patient has no known allergies.    Review of Systems   Review of Systems  Musculoskeletal:  Positive for arthralgias.    Physical Exam Updated Vital Signs BP (!) 144/88 (BP Location: Left Arm)   Pulse 72   Temp 97.9 F (36.6 C)   Resp 18   SpO2 100%  Physical Exam Vitals and nursing note reviewed.  Constitutional:      General: He is not in acute distress.    Appearance: He is well-developed.  HENT:     Head: Normocephalic and atraumatic.  Eyes:     Conjunctiva/sclera: Conjunctivae normal.   Cardiovascular:     Rate and Rhythm: Normal rate and regular rhythm.     Heart sounds: No murmur heard. Pulmonary:     Effort: Pulmonary effort is normal. No respiratory distress.     Breath sounds: Normal breath sounds.  Abdominal:     Palpations: Abdomen is soft.     Tenderness: There is no abdominal tenderness.  Musculoskeletal:        General: Swelling, tenderness (Significant tenderness along the right ankle and foot) and signs of injury present. No deformity.     Cervical back: Neck supple.  Skin:    General: Skin is warm and dry.     Capillary Refill: Capillary refill takes less than 2 seconds.  Neurological:     Mental Status: He is alert.  Psychiatric:        Mood and Affect: Mood normal.     ED Results / Procedures / Treatments   Labs (all labs ordered are listed, but only abnormal results are displayed) Labs Reviewed - No data to display  EKG None  Radiology DG Ankle Complete Right  Result Date: 02/20/2022 CLINICAL DATA:  Trauma, pain EXAM: RIGHT ANKLE - COMPLETE 3+ VIEW COMPARISON:  None FINDINGS: There is comminuted fracture in the anterior aspect of talus. There is 4 mm distraction of fracture fragments. Fracture lines are extending to the subtalar joint. There  is fracture in the base of right fifth metatarsal. There is 5 mm offset in alignment of fracture fragments in the fifth metatarsal base. There is possibly fracture in posterior aspect of cuboid. If clinically warranted, follow-up CT may be considered. IMPRESSION: There is comminuted slightly displaced fracture in the anterior calcaneus. Displaced fracture is seen in the base of right fifth metatarsal. Possible fracture is seen in the cuboid. If clinically warranted, follow-up CT may be considered. Electronically Signed   By: Elmer Picker M.D.   On: 02/20/2022 21:11    Procedures .Ortho Injury Treatment  Date/Time: 02/20/2022 10:08 PM  Performed by: Dorothyann Peng, PA-C Authorized by: Dorothyann Peng, PA-C   Consent:    Consent obtained:  Verbal   Consent given by:  Patient   Risks discussed:  Restricted joint movement, stiffness, nerve damage and vascular damage   Alternatives discussed:  No treatmentInjury location: foot Location details: right foot Injury type: fracture Fracture type: calcaneal Pre-procedure neurovascular assessment: neurovascularly intact Immobilization: splint Splint type: short leg Splint Applied by: Sheliah Hatch Post-procedure neurovascular assessment: post-procedure neurovascularly intact       Medications Ordered in ED Medications  fentaNYL (SUBLIMAZE) injection 50 mcg (50 mcg Intravenous Given 02/20/22 2053)    ED Course/ Medical Decision Making/ A&P                           Medical Decision Making Amount and/or Complexity of Data Reviewed Radiology: ordered.  Risk Prescription drug management.   This patient presents to the ED for concern of right-sided pain, this involves an extensive number of treatment options, and is a complaint that carries with it a high risk of complications and morbidity.  The differential diagnosis includes fracture, dislocation, soft tissue injury, and others   Co morbidities that complicate the patient evaluation  None   Additional history obtained:  Additional history obtained from EMS   Imaging Studies ordered:  I ordered imaging studies including plain films of the right ankle and CT of the right foot I independently visualized and interpreted imaging which showed  There is comminuted slightly displaced fracture in the anterior  calcaneus. Displaced fracture is seen in the base of right fifth  metatarsal. Possible fracture is seen in the cuboid  CT results pending I agree with the radiologist interpretation  Consultations Obtained:  I requested consultation with the orthopedic surgeon on-call, Dr.[email or web address]  and discussed lab and imaging findings as well as pertinent plan - they recommend:  Short leg with posterior stirrup splint and follow-up in the office   Problem List / ED Course / Critical interventions / Medication management   I ordered medication including fentanyl for pain Reevaluation of the patient after these medicines showed that the patient improved I have reviewed the patients home medicines and have made adjustments as needed   Social Determinants of Health:  Patient states he has no insurance   Test / Admission - Considered:  There is no indication at this time for admission.  Patient was placed in a splint as noted above.  Patient to follow-up outpatient with orthopedics.  I have prescribed pain medication for the patient to get him through the next 2 days.  Crutches and instruction also provided.        Final Clinical Impression(s) / ED Diagnoses Final diagnoses:  Closed fracture of right foot, initial encounter    Rx / DC Orders ED Discharge Orders  Ordered    HYDROcodone-acetaminophen (NORCO/VICODIN) 5-325 MG tablet  Every 4 hours PRN        02/20/22 2205              Darrick Grinder, PA-C 02/20/22 2207    Darrick Grinder, PA-C 02/20/22 2208    Arby Barrette, MD 02/20/22 2231

## 2022-02-20 NOTE — Discharge Instructions (Signed)
You were diagnosed today with a fracture of the calcaneus (heel) and fifth metatarsal of the right foot.  Please maintain the splint that you were placed in today.  Please use the crutches that were provided.  I have prescribed a short course of pain medication to be used as needed.  Please call and follow-up with the listed orthopedic provider tomorrow for further evaluation and management

## 2022-02-20 NOTE — ED Notes (Signed)
  Ortho tech called for splint application.

## 2022-02-20 NOTE — ED Triage Notes (Addendum)
Pt BIB [facility] for ankle injury/swelling . Pt jumped over a fence and rolled his right ankle- swelling noted and EMS splint in place
